# Patient Record
Sex: Female | Born: 1940 | Race: Black or African American | Hispanic: No | Marital: Married
Health system: Southern US, Community
[De-identification: ages and names within clinical notes are randomized; demographics above are authoritative.]

---

## 2004-11-21 ENCOUNTER — Ambulatory Visit: Payer: Self-pay | Admitting: General Practice

## 2005-12-25 ENCOUNTER — Ambulatory Visit: Payer: Self-pay

## 2006-12-22 ENCOUNTER — Ambulatory Visit: Payer: Self-pay

## 2014-06-07 ENCOUNTER — Other Ambulatory Visit: Payer: Self-pay | Admitting: Family Medicine

## 2014-06-07 DIAGNOSIS — Z1231 Encounter for screening mammogram for malignant neoplasm of breast: Secondary | ICD-10-CM

## 2014-06-07 DIAGNOSIS — N959 Unspecified menopausal and perimenopausal disorder: Secondary | ICD-10-CM

## 2015-12-26 ENCOUNTER — Other Ambulatory Visit: Payer: Self-pay | Admitting: Internal Medicine

## 2015-12-26 DIAGNOSIS — Z1231 Encounter for screening mammogram for malignant neoplasm of breast: Secondary | ICD-10-CM

## 2016-02-08 ENCOUNTER — Ambulatory Visit: Payer: Self-pay | Attending: Internal Medicine

## 2018-08-16 ENCOUNTER — Other Ambulatory Visit: Payer: Self-pay | Admitting: Nurse Practitioner

## 2018-08-16 DIAGNOSIS — Z1231 Encounter for screening mammogram for malignant neoplasm of breast: Secondary | ICD-10-CM

## 2019-05-04 ENCOUNTER — Other Ambulatory Visit: Payer: Self-pay | Admitting: Nurse Practitioner

## 2019-05-04 DIAGNOSIS — Z1231 Encounter for screening mammogram for malignant neoplasm of breast: Secondary | ICD-10-CM

## 2019-05-27 ENCOUNTER — Ambulatory Visit: Payer: Self-pay | Admitting: Dermatology

## 2019-06-08 ENCOUNTER — Ambulatory Visit: Payer: Self-pay | Admitting: Dermatology

## 2019-07-27 ENCOUNTER — Ambulatory Visit
Admission: RE | Admit: 2019-07-27 | Discharge: 2019-07-27 | Disposition: A | Discharge status: Home / Self Care | Payer: Medicare HMO | Source: Ambulatory Visit | Attending: Nurse Practitioner | Admitting: Nurse Practitioner

## 2019-07-27 ENCOUNTER — Encounter: Payer: Self-pay | Admitting: Radiology

## 2019-07-27 DIAGNOSIS — Z1231 Encounter for screening mammogram for malignant neoplasm of breast: Secondary | ICD-10-CM | POA: Insufficient documentation

## 2019-08-01 ENCOUNTER — Other Ambulatory Visit: Payer: Self-pay | Admitting: Nurse Practitioner

## 2019-08-01 DIAGNOSIS — N6489 Other specified disorders of breast: Secondary | ICD-10-CM

## 2019-08-01 DIAGNOSIS — N6320 Unspecified lump in the left breast, unspecified quadrant: Secondary | ICD-10-CM

## 2019-08-01 DIAGNOSIS — R928 Other abnormal and inconclusive findings on diagnostic imaging of breast: Secondary | ICD-10-CM

## 2020-10-01 DIAGNOSIS — M79601 Pain in right arm: Secondary | ICD-10-CM | POA: Diagnosis not present

## 2020-10-01 DIAGNOSIS — G629 Polyneuropathy, unspecified: Secondary | ICD-10-CM | POA: Diagnosis not present

## 2020-10-01 DIAGNOSIS — M1990 Unspecified osteoarthritis, unspecified site: Secondary | ICD-10-CM | POA: Diagnosis not present

## 2020-10-01 DIAGNOSIS — F0280 Dementia in other diseases classified elsewhere without behavioral disturbance: Secondary | ICD-10-CM | POA: Diagnosis not present

## 2020-10-29 DIAGNOSIS — I10 Essential (primary) hypertension: Secondary | ICD-10-CM | POA: Diagnosis not present

## 2020-10-29 DIAGNOSIS — E782 Mixed hyperlipidemia: Secondary | ICD-10-CM | POA: Diagnosis not present

## 2020-10-29 DIAGNOSIS — M1990 Unspecified osteoarthritis, unspecified site: Secondary | ICD-10-CM | POA: Diagnosis not present

## 2020-10-29 DIAGNOSIS — R5383 Other fatigue: Secondary | ICD-10-CM | POA: Diagnosis not present

## 2020-10-29 DIAGNOSIS — E785 Hyperlipidemia, unspecified: Secondary | ICD-10-CM | POA: Diagnosis not present

## 2020-10-29 DIAGNOSIS — R7303 Prediabetes: Secondary | ICD-10-CM | POA: Diagnosis not present

## 2021-04-29 DIAGNOSIS — F0280 Dementia in other diseases classified elsewhere without behavioral disturbance: Secondary | ICD-10-CM | POA: Diagnosis not present

## 2021-04-29 DIAGNOSIS — E782 Mixed hyperlipidemia: Secondary | ICD-10-CM | POA: Diagnosis not present

## 2021-04-29 DIAGNOSIS — I10 Essential (primary) hypertension: Secondary | ICD-10-CM | POA: Diagnosis not present

## 2021-04-29 DIAGNOSIS — E876 Hypokalemia: Secondary | ICD-10-CM | POA: Diagnosis not present

## 2021-04-29 DIAGNOSIS — R7303 Prediabetes: Secondary | ICD-10-CM | POA: Diagnosis not present

## 2021-04-29 DIAGNOSIS — E785 Hyperlipidemia, unspecified: Secondary | ICD-10-CM | POA: Diagnosis not present

## 2021-05-13 DIAGNOSIS — E876 Hypokalemia: Secondary | ICD-10-CM | POA: Diagnosis not present

## 2021-05-27 DIAGNOSIS — E876 Hypokalemia: Secondary | ICD-10-CM | POA: Diagnosis not present

## 2021-06-28 DIAGNOSIS — E876 Hypokalemia: Secondary | ICD-10-CM | POA: Diagnosis not present

## 2021-09-03 DIAGNOSIS — I10 Essential (primary) hypertension: Secondary | ICD-10-CM | POA: Diagnosis not present

## 2021-09-03 DIAGNOSIS — E782 Mixed hyperlipidemia: Secondary | ICD-10-CM | POA: Diagnosis not present

## 2021-09-03 DIAGNOSIS — R7303 Prediabetes: Secondary | ICD-10-CM | POA: Diagnosis not present

## 2021-09-03 DIAGNOSIS — F0280 Dementia in other diseases classified elsewhere without behavioral disturbance: Secondary | ICD-10-CM | POA: Diagnosis not present

## 2022-01-07 DIAGNOSIS — E782 Mixed hyperlipidemia: Secondary | ICD-10-CM | POA: Diagnosis not present

## 2022-01-07 DIAGNOSIS — R7303 Prediabetes: Secondary | ICD-10-CM | POA: Diagnosis not present

## 2022-01-07 DIAGNOSIS — I10 Essential (primary) hypertension: Secondary | ICD-10-CM | POA: Diagnosis not present

## 2022-01-07 DIAGNOSIS — R5383 Other fatigue: Secondary | ICD-10-CM | POA: Diagnosis not present

## 2022-01-07 DIAGNOSIS — F0280 Dementia in other diseases classified elsewhere without behavioral disturbance: Secondary | ICD-10-CM | POA: Diagnosis not present

## 2022-01-10 DIAGNOSIS — R945 Abnormal results of liver function studies: Secondary | ICD-10-CM | POA: Diagnosis not present

## 2022-01-15 DIAGNOSIS — R945 Abnormal results of liver function studies: Secondary | ICD-10-CM | POA: Diagnosis not present

## 2022-03-22 IMAGING — MG DIGITAL SCREENING BILAT W/ TOMO W/ CAD
8 series · 9 of 24 positions shown · non-contrast
Comparison: Previous exam(s).

CLINICAL DATA: Screening.

EXAM:
DIGITAL SCREENING BILATERAL MAMMOGRAM WITH TOMO AND CAD

[L MLO synth-2D]
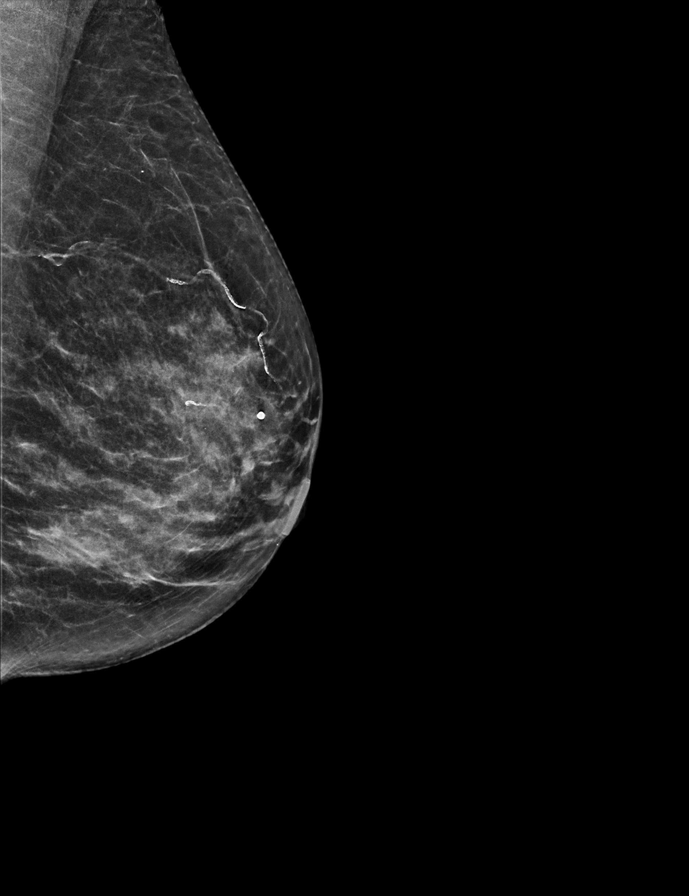

[L CC synth-2D]
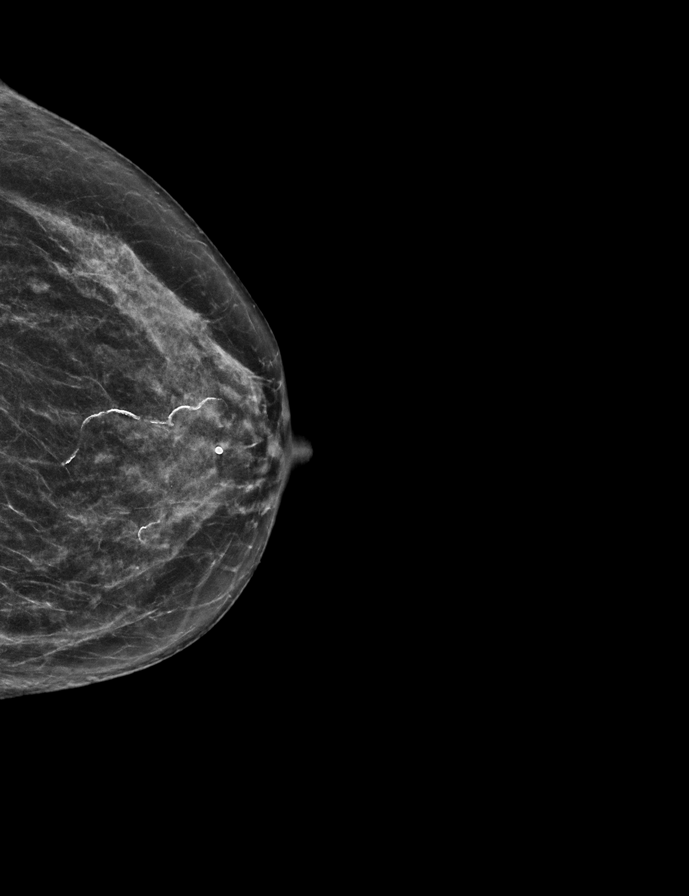

[R CC synth-2D]
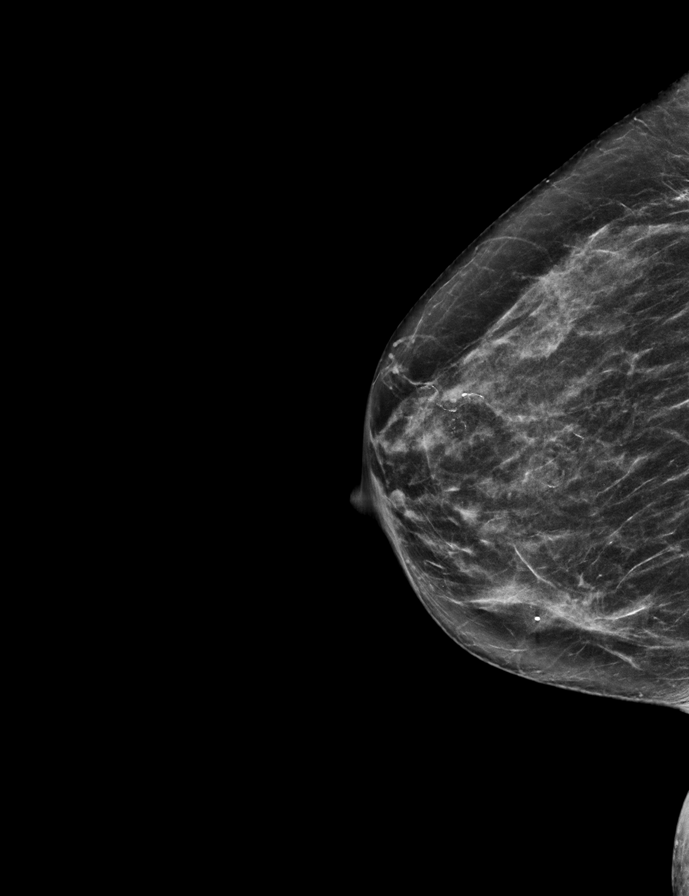

[R MLO synth-2D]
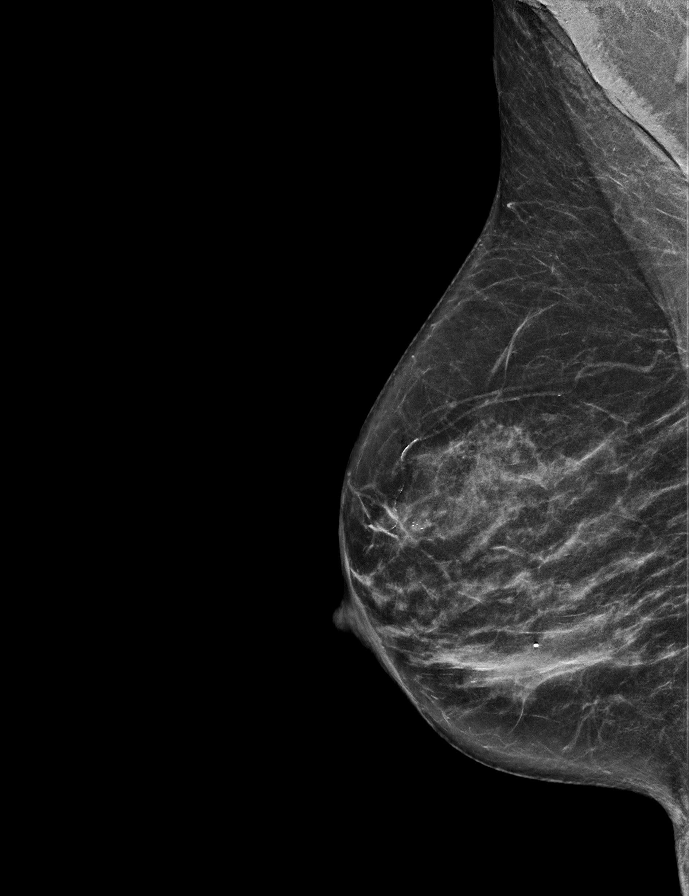

[R CC tomo · 2 of 58 frames shown]
[frame 19/58]
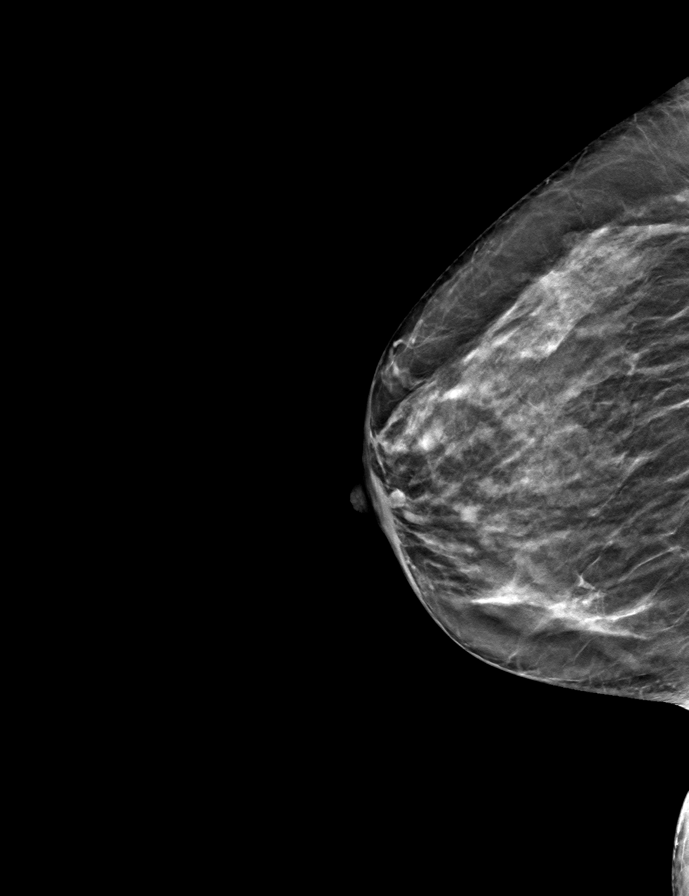
[frame 29/58]
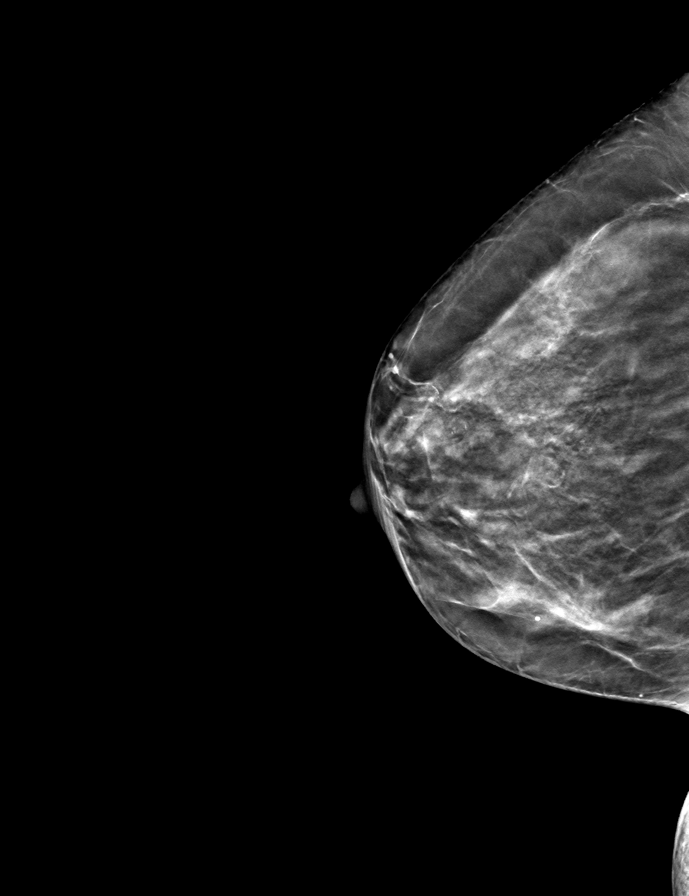

[L MLO tomo · tomo slice 25/49.0]
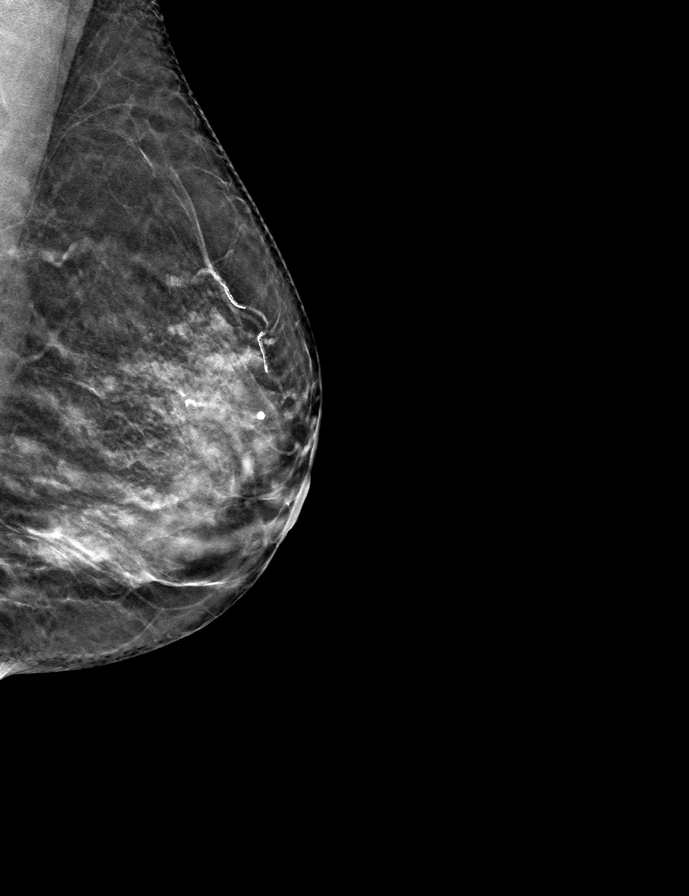

[R MLO tomo · tomo slice 29/58.0]
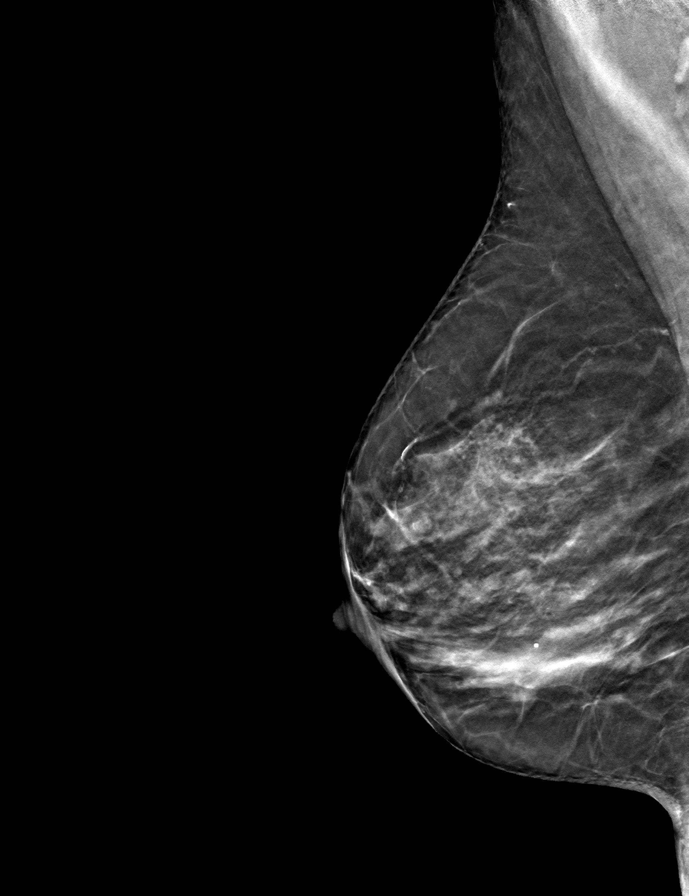

[L CC tomo · tomo slice 26/51.0]
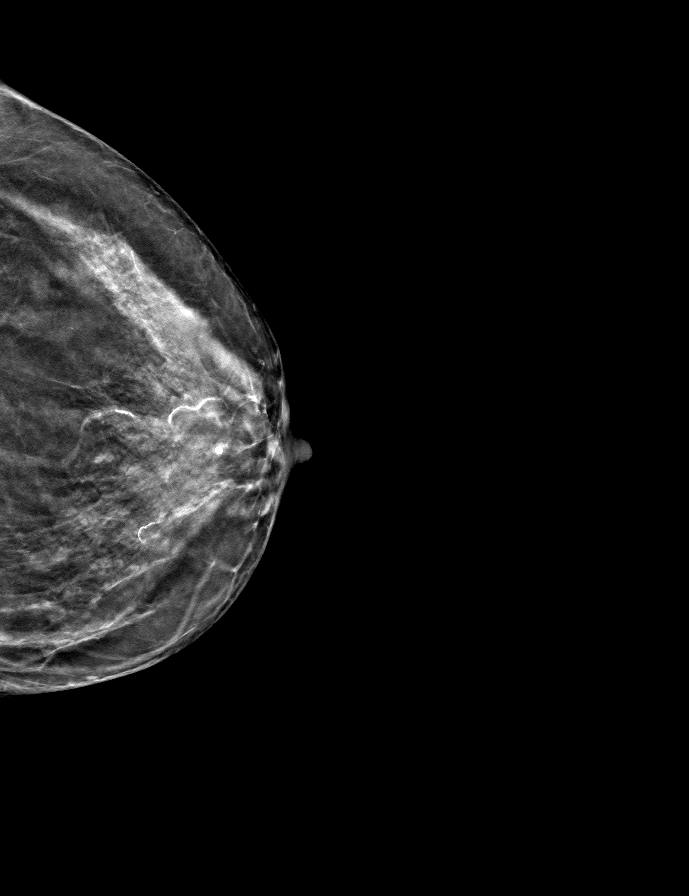

[9 of 24 positions shown; findings below may reference images not displayed]

ACR Breast Density Category c: The breast tissue is heterogeneously
dense, which may obscure small masses.
FINDINGS: In the right breast a possible asymmetry requires further
evaluation.

In the left breast a possible mass requires further evaluation.

Images were processed with CAD.
IMPRESSION: Further evaluation is suggested for possible asymmetry in the right
breast.

Further evaluation is suggested for possible mass in the left
breast.

RECOMMENDATION:
Diagnostic mammogram and possibly ultrasound of both breasts.
(Code:9G-A-[id number])

The patient will be contacted regarding the findings, and additional
imaging will be scheduled.

BI-RADS CATEGORY  0: Incomplete. Need additional imaging evaluation
and/or prior mammograms for comparison.

## 2022-05-08 ENCOUNTER — Ambulatory Visit: Payer: Medicare HMO | Admitting: Nurse Practitioner

## 2022-06-06 ENCOUNTER — Ambulatory Visit: Payer: Medicare HMO | Admitting: Nurse Practitioner

## 2022-06-10 ENCOUNTER — Other Ambulatory Visit: Payer: Self-pay | Admitting: Nurse Practitioner

## 2022-06-10 ENCOUNTER — Ambulatory Visit (INDEPENDENT_AMBULATORY_CARE_PROVIDER_SITE_OTHER): Payer: Medicare HMO | Admitting: Nurse Practitioner

## 2022-06-10 VITALS — BP 116/74 | HR 68 | Ht 66.0 in | Wt 137.2 lb

## 2022-06-10 DIAGNOSIS — E782 Mixed hyperlipidemia: Secondary | ICD-10-CM

## 2022-06-10 DIAGNOSIS — R7301 Impaired fasting glucose: Secondary | ICD-10-CM | POA: Diagnosis not present

## 2022-06-10 DIAGNOSIS — R7303 Prediabetes: Secondary | ICD-10-CM | POA: Diagnosis not present

## 2022-06-10 DIAGNOSIS — R5383 Other fatigue: Secondary | ICD-10-CM | POA: Diagnosis not present

## 2022-06-10 DIAGNOSIS — E785 Hyperlipidemia, unspecified: Secondary | ICD-10-CM | POA: Diagnosis not present

## 2022-06-10 DIAGNOSIS — F0390 Unspecified dementia without behavioral disturbance: Secondary | ICD-10-CM

## 2022-06-10 MED ORDER — ASPIRIN 81 MG PO TBEC: 81.0000 mg | DELAYED_RELEASE_TABLET | Freq: Every day | ORAL | 3 refills | Status: AC

## 2022-06-10 MED ORDER — DONEPEZIL HCL 10 MG PO TABS: 10.0000 mg | ORAL_TABLET | Freq: Every day | ORAL | 3 refills | Status: DC

## 2022-06-10 MED ORDER — ATORVASTATIN CALCIUM 20 MG PO TABS: 20.0000 mg | ORAL_TABLET | Freq: Every day | ORAL | 3 refills | Status: AC

## 2022-06-10 MED ORDER — KLOR-CON M20 20 MEQ PO TBCR: 20.0000 meq | EXTENDED_RELEASE_TABLET | Freq: Every morning | ORAL | 3 refills | Status: DC

## 2022-06-10 MED ORDER — LOSARTAN POTASSIUM 25 MG PO TABS: 25.0000 mg | ORAL_TABLET | Freq: Every day | ORAL | 3 refills | Status: AC

## 2022-06-10 MED ORDER — VITAMIN D 25 MCG (1000 UNIT) PO TABS: 1000.0000 [IU] | ORAL_TABLET | Freq: Every day | ORAL | 3 refills | Status: AC

## 2022-06-10 NOTE — Patient Instructions (Signed)
1) Follow up appt in 4 months, fasting labs prior 2) Fasting labs today 3) Medication check with [facility] versus [facility]

## 2022-06-10 NOTE — Progress Notes (Signed)
Established Patient Office Visit  Subjective:  Patient ID: Jenna Cabrera, female    DOB: August 02, 1940  Age: 82 y.o. MRN: [id number]  Chief Complaint  Patient presents with   Follow-up    Follow up    Follow up.  Family member with patient today due to dementia.  Neg ROS, memory slowed down.  Daughter is present today at office visit.      No other concerns at this time.   No past medical history on file.  No past surgical history on file.  Social History   Socioeconomic History   Marital status: Married    Spouse name: Not on file   Number of children: Not on file   Years of education: Not on file   Highest education level: Not on file  Occupational History   Not on file  Tobacco Use   Smoking status: Not on file   Smokeless tobacco: Not on file  Substance and Sexual Activity   Alcohol use: Not on file   Drug use: Not on file   Sexual activity: Not on file  Other Topics Concern   Not on file  Social History Narrative   Not on file   Social Determinants of Health   Financial Resource Strain: Not on file  Food Insecurity: Not on file  Transportation Needs: Not on file  Physical Activity: Not on file  Stress: Not on file  Social Connections: Not on file  Intimate Partner Violence: Not on file    No family history on file.  No Known Allergies  Review of Systems  Constitutional: Negative.   HENT: Negative.    Eyes: Negative.   Respiratory: Negative.    Cardiovascular: Negative.   Gastrointestinal: Negative.   Genitourinary: Negative.   Musculoskeletal: Negative.   Skin: Negative.   Neurological: Negative.   Endo/Heme/Allergies: Negative.   Psychiatric/Behavioral:  Positive for memory loss.        Objective:   Ht 5\' 6"  (1.676 m)   Wt 137 lb 3.2 oz (62.2 kg)   BMI 22.14 kg/m   Vitals:   06/10/22 0907  Height: 5\' 6"  (1.676 m)  Weight: 137 lb 3.2 oz (62.2 kg)  BMI (Calculated): 22.16    Physical Exam Vitals reviewed.  Constitutional:       Appearance: Normal appearance.  HENT:     Head: Normocephalic.     Nose: Nose normal.     Mouth/Throat:     Mouth: Mucous membranes are moist.  Eyes:     Pupils: Pupils are equal, round, and reactive to light.  Cardiovascular:     Rate and Rhythm: Normal rate and regular rhythm.  Pulmonary:     Effort: Pulmonary effort is normal.     Breath sounds: Normal breath sounds.  Abdominal:     General: Bowel sounds are normal.     Palpations: Abdomen is soft.  Musculoskeletal:        General: Normal range of motion.     Cervical back: Normal range of motion and neck supple.  Skin:    General: Skin is warm and dry.  Neurological:     Mental Status: She is alert. Mental status is at baseline.  Psychiatric:        Mood and Affect: Mood normal.        Behavior: Behavior normal.      No results found for any visits on 06/10/22.  No results found for this or any previous visit (from the past 2160  hour(s)).    Assessment & Plan:   Problem List Items Addressed This Visit   None   No follow-ups on file.   Total time spent: 35 minutes  Orson Eva, NP  06/10/2022

## 2022-06-11 LAB — CMP14+EGFR
ALT: 13 IU/L (ref 0–32)
AST: 19 IU/L (ref 0–40)
Albumin/Globulin Ratio: 1.5 (ref 1.2–2.2)
Albumin: 4.1 g/dL (ref 3.7–4.7)
Alkaline Phosphatase: 87 IU/L (ref 44–121)
BUN/Creatinine Ratio: 29 — ABNORMAL HIGH (ref 12–28)
BUN: 23 mg/dL (ref 8–27)
Bilirubin Total: 0.9 mg/dL (ref 0.0–1.2)
CO2: 21 mmol/L (ref 20–29)
Calcium: 9.4 mg/dL (ref 8.7–10.3)
Chloride: 111 mmol/L — ABNORMAL HIGH (ref 96–106)
Creatinine, Ser: 0.8 mg/dL (ref 0.57–1.00)
Globulin, Total: 2.7 g/dL (ref 1.5–4.5)
Glucose: 108 mg/dL — ABNORMAL HIGH (ref 70–99)
Potassium: 3.7 mmol/L (ref 3.5–5.2)
Sodium: 149 mmol/L — ABNORMAL HIGH (ref 134–144)
Total Protein: 6.8 g/dL (ref 6.0–8.5)
eGFR: 74 mL/min/{1.73_m2} (ref >59)

## 2022-06-11 LAB — LIPID PANEL
Chol/HDL Ratio: 2.7 ratio (ref 0.0–4.4)
Cholesterol, Total: 203 mg/dL — ABNORMAL HIGH (ref 100–199)
HDL: 74 mg/dL (ref >39)
LDL Chol Calc (NIH): 119 mg/dL — ABNORMAL HIGH (ref 0–99)
Triglycerides: 52 mg/dL (ref 0–149)
VLDL Cholesterol Cal: 10 mg/dL (ref 5–40)

## 2022-06-11 LAB — HEMOGLOBIN A1C
Est. average glucose Bld gHb Est-mCnc: 126 mg/dL
Hgb A1c MFr Bld: 6 % — ABNORMAL HIGH (ref 4.8–5.6)

## 2022-06-11 LAB — TSH: TSH: 3.98 u[IU]/mL (ref 0.450–4.500)

## 2022-06-15 ENCOUNTER — Other Ambulatory Visit: Payer: Self-pay | Admitting: Nurse Practitioner

## 2022-07-03 ENCOUNTER — Other Ambulatory Visit: Payer: Self-pay | Admitting: Nurse Practitioner

## 2022-07-04 ENCOUNTER — Other Ambulatory Visit: Payer: Self-pay | Admitting: Nurse Practitioner

## 2022-07-04 NOTE — Telephone Encounter (Signed)
Pt should be taking losartan and I'm not sure how enalapril was entered unless patient did not have med bottles with her at last visit.  I prefer losartan over the ACEI at all times

## 2022-07-14 ENCOUNTER — Telehealth: Payer: Self-pay

## 2022-07-14 NOTE — Telephone Encounter (Signed)
Pharmacy called stating they need clarification on wether or not the patient is taking losartan and enalapril?

## 2022-07-29 NOTE — Telephone Encounter (Signed)
One of her relatives works there at that [employer] so will need to ask the patient's caregiver who does her meds but is also on her HIPAA as the patient has dementia

## 2022-07-30 NOTE — Telephone Encounter (Signed)
Spoke with patient daughter and she will  either set up mychart or she will get back with me on Friday morning with what she should be taking

## 2022-10-10 ENCOUNTER — Ambulatory Visit: Payer: Medicare HMO | Admitting: Internal Medicine

## 2022-11-10 ENCOUNTER — Other Ambulatory Visit: Payer: Self-pay | Admitting: Cardiology

## 2022-11-10 MED ORDER — MEMANTINE HCL 10 MG PO TABS: 10.0000 mg | ORAL_TABLET | Freq: Every day | ORAL | 1 refills | Status: AC

## 2023-08-06 ENCOUNTER — Other Ambulatory Visit: Payer: Self-pay

## 2023-08-06 MED ORDER — DONEPEZIL HCL 10 MG PO TABS: 10.0000 mg | ORAL_TABLET | Freq: Every day | ORAL | 3 refills | Status: AC

## 2023-08-27 DIAGNOSIS — G40909 Epilepsy, unspecified, not intractable, without status epilepticus: Secondary | ICD-10-CM | POA: Diagnosis not present

## 2023-08-27 DIAGNOSIS — F0153 Vascular dementia, unspecified severity, with mood disturbance: Secondary | ICD-10-CM | POA: Diagnosis not present

## 2023-08-27 DIAGNOSIS — G629 Polyneuropathy, unspecified: Secondary | ICD-10-CM | POA: Diagnosis not present

## 2023-08-27 DIAGNOSIS — I69351 Hemiplegia and hemiparesis following cerebral infarction affecting right dominant side: Secondary | ICD-10-CM | POA: Diagnosis not present

## 2023-08-27 DIAGNOSIS — I509 Heart failure, unspecified: Secondary | ICD-10-CM | POA: Diagnosis not present

## 2023-08-27 DIAGNOSIS — I110 Hypertensive heart disease with heart failure: Secondary | ICD-10-CM | POA: Diagnosis not present

## 2023-08-27 DIAGNOSIS — E785 Hyperlipidemia, unspecified: Secondary | ICD-10-CM | POA: Diagnosis not present

## 2023-08-27 DIAGNOSIS — G309 Alzheimer's disease, unspecified: Secondary | ICD-10-CM | POA: Diagnosis not present

## 2023-08-27 DIAGNOSIS — H81319 Aural vertigo, unspecified ear: Secondary | ICD-10-CM | POA: Diagnosis not present

## 2023-08-27 DIAGNOSIS — R011 Cardiac murmur, unspecified: Secondary | ICD-10-CM | POA: Diagnosis not present

## 2023-08-27 DIAGNOSIS — I739 Peripheral vascular disease, unspecified: Secondary | ICD-10-CM | POA: Diagnosis not present

## 2023-08-27 DIAGNOSIS — I2510 Atherosclerotic heart disease of native coronary artery without angina pectoris: Secondary | ICD-10-CM | POA: Diagnosis not present

## 2023-08-27 DIAGNOSIS — E876 Hypokalemia: Secondary | ICD-10-CM | POA: Diagnosis not present

## 2023-08-27 DIAGNOSIS — I4891 Unspecified atrial fibrillation: Secondary | ICD-10-CM | POA: Diagnosis not present

## 2023-08-27 DIAGNOSIS — Z823 Family history of stroke: Secondary | ICD-10-CM | POA: Diagnosis not present

## 2023-08-27 DIAGNOSIS — F325 Major depressive disorder, single episode, in full remission: Secondary | ICD-10-CM | POA: Diagnosis not present

## 2023-08-27 DIAGNOSIS — E039 Hypothyroidism, unspecified: Secondary | ICD-10-CM | POA: Diagnosis not present

## 2023-08-27 DIAGNOSIS — R7303 Prediabetes: Secondary | ICD-10-CM | POA: Diagnosis not present

## 2023-08-27 DIAGNOSIS — D6869 Other thrombophilia: Secondary | ICD-10-CM | POA: Diagnosis not present

## 2023-08-27 DIAGNOSIS — Z7902 Long term (current) use of antithrombotics/antiplatelets: Secondary | ICD-10-CM | POA: Diagnosis not present
